# Patient Record
Sex: Male | Born: 1965 | Race: Black or African American | Hispanic: No | Marital: Single | Smoking: Current every day smoker
Health system: Southern US, Community
[De-identification: ages and names within clinical notes are randomized; demographics above are authoritative.]

## PROBLEM LIST (undated history)

## (undated) DIAGNOSIS — I10 Essential (primary) hypertension: Secondary | ICD-10-CM

---

## 1998-03-08 ENCOUNTER — Emergency Department (HOSPITAL_COMMUNITY): Admission: EM | Admit: 1998-03-08 | Discharge: 1998-03-08 | Payer: Self-pay | Admitting: Emergency Medicine

## 1998-03-15 ENCOUNTER — Emergency Department (HOSPITAL_COMMUNITY): Admission: EM | Admit: 1998-03-15 | Discharge: 1998-03-15 | Payer: Self-pay | Admitting: Emergency Medicine

## 2004-04-12 ENCOUNTER — Emergency Department (HOSPITAL_COMMUNITY): Admission: EM | Admit: 2004-04-12 | Discharge: 2004-04-12 | Payer: Self-pay | Admitting: Family Medicine

## 2006-02-03 ENCOUNTER — Emergency Department (HOSPITAL_COMMUNITY): Admission: EM | Admit: 2006-02-03 | Discharge: 2006-02-03 | Payer: Self-pay | Admitting: Emergency Medicine

## 2006-11-01 ENCOUNTER — Ambulatory Visit: Payer: Self-pay | Admitting: Family Medicine

## 2006-11-02 ENCOUNTER — Ambulatory Visit: Payer: Self-pay | Admitting: *Deleted

## 2007-12-11 ENCOUNTER — Encounter (INDEPENDENT_AMBULATORY_CARE_PROVIDER_SITE_OTHER): Payer: Self-pay | Admitting: Family Medicine

## 2007-12-11 ENCOUNTER — Ambulatory Visit: Payer: Self-pay | Admitting: Internal Medicine

## 2007-12-11 LAB — CONVERTED CEMR LAB
ALT: 23 units/L (ref 0–53)
Alkaline Phosphatase: 67 units/L (ref 39–117)
Basophils Absolute: 0 10*3/uL (ref 0.0–0.1)
Eosinophils Absolute: 0.1 10*3/uL (ref 0.0–0.7)
Eosinophils Relative: 3 % (ref 0–5)
Glucose, Bld: 79 mg/dL (ref 70–99)
HCT: 45.9 % (ref 39.0–52.0)
LDL Cholesterol: 119 mg/dL — ABNORMAL HIGH (ref 0–99)
MCV: 89 fL (ref 78.0–100.0)
Platelets: 262 10*3/uL (ref 150–400)
RDW: 13.5 % (ref 11.5–15.5)
Sodium: 141 meq/L (ref 135–145)
Total Bilirubin: 0.3 mg/dL (ref 0.3–1.2)
Total Protein: 7.2 g/dL (ref 6.0–8.3)
Triglycerides: 140 mg/dL (ref <150)
VLDL: 28 mg/dL (ref 0–40)

## 2008-06-27 ENCOUNTER — Ambulatory Visit: Payer: Self-pay | Admitting: Family Medicine

## 2008-07-24 ENCOUNTER — Ambulatory Visit (HOSPITAL_COMMUNITY): Admission: RE | Admit: 2008-07-24 | Discharge: 2008-07-24 | Payer: Self-pay | Admitting: Internal Medicine

## 2008-07-29 ENCOUNTER — Encounter (INDEPENDENT_AMBULATORY_CARE_PROVIDER_SITE_OTHER): Payer: Self-pay | Admitting: Adult Health

## 2008-07-29 ENCOUNTER — Ambulatory Visit: Payer: Self-pay | Admitting: Internal Medicine

## 2008-07-29 LAB — CONVERTED CEMR LAB
ALT: 16 units/L (ref 0–53)
Albumin: 4.6 g/dL (ref 3.5–5.2)
Basophils Absolute: 0 10*3/uL (ref 0.0–0.1)
CO2: 24 meq/L (ref 19–32)
Calcium: 9.5 mg/dL (ref 8.4–10.5)
Chloride: 105 meq/L (ref 96–112)
Cholesterol: 187 mg/dL (ref 0–200)
Hemoglobin: 16.2 g/dL (ref 13.0–17.0)
Lymphocytes Relative: 34 % (ref 12–46)
Monocytes Absolute: 0.4 10*3/uL (ref 0.1–1.0)
Neutro Abs: 2.8 10*3/uL (ref 1.7–7.7)
Neutrophils Relative %: 55 % (ref 43–77)
Potassium: 4.3 meq/L (ref 3.5–5.3)
RDW: 14 % (ref 11.5–15.5)
Sodium: 142 meq/L (ref 135–145)
Total Protein: 7.7 g/dL (ref 6.0–8.3)

## 2008-07-30 ENCOUNTER — Encounter (INDEPENDENT_AMBULATORY_CARE_PROVIDER_SITE_OTHER): Payer: Self-pay | Admitting: Adult Health

## 2008-07-30 LAB — CONVERTED CEMR LAB: Hgb A1c MFr Bld: 5.7 % (ref 4.6–6.1)

## 2008-08-06 ENCOUNTER — Encounter: Admission: RE | Admit: 2008-08-06 | Discharge: 2008-11-04 | Payer: Self-pay | Admitting: Internal Medicine

## 2008-10-21 ENCOUNTER — Ambulatory Visit: Payer: Self-pay | Admitting: Family Medicine

## 2008-10-22 ENCOUNTER — Encounter (INDEPENDENT_AMBULATORY_CARE_PROVIDER_SITE_OTHER): Payer: Self-pay | Admitting: Adult Health

## 2008-10-22 LAB — CONVERTED CEMR LAB
ALT: 12 units/L (ref 0–53)
AST: 11 units/L (ref 0–37)
Albumin: 4.7 g/dL (ref 3.5–5.2)
Alkaline Phosphatase: 78 units/L (ref 39–117)
Calcium: 9.9 mg/dL (ref 8.4–10.5)
Chlamydia, Swab/Urine, PCR: NEGATIVE
Chloride: 107 meq/L (ref 96–112)
GC Probe Amp, Urine: NEGATIVE
LDL Cholesterol: 117 mg/dL — ABNORMAL HIGH (ref 0–99)
PSA: 0.88 ng/mL (ref 0.10–4.00)
Potassium: 4.2 meq/L (ref 3.5–5.3)
Total CHOL/HDL Ratio: 4.3

## 2008-11-05 ENCOUNTER — Ambulatory Visit: Payer: Self-pay | Admitting: Cardiology

## 2008-11-05 ENCOUNTER — Encounter: Payer: Self-pay | Admitting: Internal Medicine

## 2008-11-05 ENCOUNTER — Ambulatory Visit (HOSPITAL_COMMUNITY): Admission: RE | Admit: 2008-11-05 | Discharge: 2008-11-05 | Payer: Self-pay | Admitting: Internal Medicine

## 2009-01-05 ENCOUNTER — Ambulatory Visit: Payer: Self-pay | Admitting: Internal Medicine

## 2009-01-14 ENCOUNTER — Ambulatory Visit: Payer: Self-pay | Admitting: Internal Medicine

## 2009-02-23 ENCOUNTER — Emergency Department (HOSPITAL_COMMUNITY): Admission: EM | Admit: 2009-02-23 | Discharge: 2009-02-23 | Payer: Self-pay | Admitting: Emergency Medicine

## 2009-03-04 ENCOUNTER — Encounter (INDEPENDENT_AMBULATORY_CARE_PROVIDER_SITE_OTHER): Payer: Self-pay | Admitting: Adult Health

## 2009-03-04 ENCOUNTER — Ambulatory Visit: Payer: Self-pay | Admitting: Internal Medicine

## 2009-03-04 LAB — CONVERTED CEMR LAB
AST: 15 units/L (ref 0–37)
Albumin: 4.6 g/dL (ref 3.5–5.2)
Alkaline Phosphatase: 74 units/L (ref 39–117)
BUN: 16 mg/dL (ref 6–23)
Glucose, Bld: 97 mg/dL (ref 70–99)
HDL: 41 mg/dL (ref >39)
LDL Cholesterol: 93 mg/dL (ref 0–99)
Potassium: 4 meq/L (ref 3.5–5.3)
Sodium: 142 meq/L (ref 135–145)
Total Bilirubin: 0.3 mg/dL (ref 0.3–1.2)
Triglycerides: 59 mg/dL (ref <150)
VLDL: 12 mg/dL (ref 0–40)

## 2009-03-11 ENCOUNTER — Ambulatory Visit: Payer: Self-pay | Admitting: Internal Medicine

## 2009-03-11 ENCOUNTER — Encounter (INDEPENDENT_AMBULATORY_CARE_PROVIDER_SITE_OTHER): Payer: Self-pay | Admitting: Adult Health

## 2009-03-11 LAB — CONVERTED CEMR LAB
Bacteria, UA: NONE SEEN
Microalb, Ur: 3.14 mg/dL — ABNORMAL HIGH (ref 0.00–1.89)

## 2009-03-24 ENCOUNTER — Ambulatory Visit (HOSPITAL_COMMUNITY): Admission: RE | Admit: 2009-03-24 | Discharge: 2009-03-24 | Payer: Self-pay | Admitting: Internal Medicine

## 2009-04-01 ENCOUNTER — Ambulatory Visit: Payer: Self-pay | Admitting: Internal Medicine

## 2009-04-01 ENCOUNTER — Encounter (INDEPENDENT_AMBULATORY_CARE_PROVIDER_SITE_OTHER): Payer: Self-pay | Admitting: Adult Health

## 2009-04-01 LAB — CONVERTED CEMR LAB
Amphetamine Screen, Ur: NEGATIVE
Barbiturate Quant, Ur: NEGATIVE
Cocaine Metabolites: NEGATIVE
Creatinine,U: 230.1 mg/dL
Marijuana Metabolite: NEGATIVE
Opiate Screen, Urine: NEGATIVE
Phencyclidine (PCP): NEGATIVE

## 2009-04-29 ENCOUNTER — Ambulatory Visit: Payer: Self-pay | Admitting: Internal Medicine

## 2009-07-01 ENCOUNTER — Ambulatory Visit: Payer: Self-pay | Admitting: Internal Medicine

## 2009-07-24 ENCOUNTER — Encounter (INDEPENDENT_AMBULATORY_CARE_PROVIDER_SITE_OTHER): Payer: Self-pay | Admitting: Adult Health

## 2009-07-24 ENCOUNTER — Ambulatory Visit: Payer: Self-pay | Admitting: Internal Medicine

## 2009-07-24 LAB — CONVERTED CEMR LAB
AST: 15 units/L (ref 0–37)
Albumin: 4.7 g/dL (ref 3.5–5.2)
BUN: 14 mg/dL (ref 6–23)
CO2: 23 meq/L (ref 19–32)
Calcium: 9.6 mg/dL (ref 8.4–10.5)
Chloride: 107 meq/L (ref 96–112)
Cholesterol: 135 mg/dL (ref 0–200)
Creatinine, Ser: 0.94 mg/dL (ref 0.40–1.50)
Glucose, Bld: 92 mg/dL (ref 70–99)
HDL: 37 mg/dL — ABNORMAL LOW (ref >39)
Potassium: 4.1 meq/L (ref 3.5–5.3)
Total CHOL/HDL Ratio: 3.6

## 2009-07-30 ENCOUNTER — Encounter (INDEPENDENT_AMBULATORY_CARE_PROVIDER_SITE_OTHER): Payer: Self-pay | Admitting: *Deleted

## 2009-09-24 ENCOUNTER — Encounter (INDEPENDENT_AMBULATORY_CARE_PROVIDER_SITE_OTHER): Payer: Self-pay | Admitting: Adult Health

## 2009-09-24 ENCOUNTER — Ambulatory Visit: Payer: Self-pay | Admitting: Family Medicine

## 2009-09-24 LAB — CONVERTED CEMR LAB: Chlamydia, Swab/Urine, PCR: NEGATIVE

## 2009-09-25 ENCOUNTER — Encounter (INDEPENDENT_AMBULATORY_CARE_PROVIDER_SITE_OTHER): Payer: Self-pay | Admitting: Adult Health

## 2009-10-27 ENCOUNTER — Ambulatory Visit: Payer: Self-pay | Admitting: Internal Medicine

## 2010-01-26 ENCOUNTER — Ambulatory Visit: Payer: Self-pay | Admitting: Internal Medicine

## 2010-01-26 ENCOUNTER — Encounter (INDEPENDENT_AMBULATORY_CARE_PROVIDER_SITE_OTHER): Payer: Self-pay | Admitting: Adult Health

## 2010-01-26 LAB — CONVERTED CEMR LAB
ALT: 31 units/L (ref 0–53)
Albumin: 4.6 g/dL (ref 3.5–5.2)
Alkaline Phosphatase: 63 units/L (ref 39–117)
CO2: 24 meq/L (ref 19–32)
Cholesterol: 127 mg/dL (ref 0–200)
LDL Cholesterol: 71 mg/dL (ref 0–99)
Potassium: 4.3 meq/L (ref 3.5–5.3)
Sodium: 140 meq/L (ref 135–145)
Total Bilirubin: 0.5 mg/dL (ref 0.3–1.2)
Total Protein: 7.1 g/dL (ref 6.0–8.3)
VLDL: 11 mg/dL (ref 0–40)
Vit D, 25-Hydroxy: 27 ng/mL — ABNORMAL LOW (ref 30–89)
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
## 2010-08-09 ENCOUNTER — Encounter: Payer: Self-pay | Admitting: Internal Medicine

## 2010-08-17 NOTE — Letter (Signed)
Summary: Generic Letter  [facility]  [address]   [place], [place] [address]   Phone: [phone]  Fax: [phone]    07/30/2009  Aaron Oneal [address] [place], [place]  [address]  Dear Aaron Oneal,  You have low Vitamin D start taking Vitamin D 50,000 International Units  one tablet weekly for 12 weeks. You also need to take Calcium 1200mg  one tablet daily which you can buy over the counter at a local pharmacy. After the 12 weeks start taking over the counter Vitamin D 1,000 International Units and continue with the calcium. If you have any questions please feel free to call us.       Sincerely,   Gaylyn Cheers RN

## 2010-09-28 ENCOUNTER — Encounter: Payer: Self-pay | Admitting: Internal Medicine

## 2010-09-28 LAB — CONVERTED CEMR LAB
Chlamydia, Swab/Urine, PCR: NEGATIVE
GC Probe Amp, Urine: NEGATIVE
Hepatitis B Surface Ag: NEGATIVE

## 2019-07-24 ENCOUNTER — Ambulatory Visit: Payer: Self-pay | Attending: Internal Medicine

## 2019-07-24 DIAGNOSIS — Z20822 Contact with and (suspected) exposure to covid-19: Secondary | ICD-10-CM | POA: Insufficient documentation

## 2019-07-25 LAB — NOVEL CORONAVIRUS, NAA: SARS-CoV-2, NAA: NOT DETECTED

## 2019-10-25 ENCOUNTER — Ambulatory Visit: Payer: Self-pay | Attending: Internal Medicine

## 2019-10-25 DIAGNOSIS — Z23 Encounter for immunization: Secondary | ICD-10-CM

## 2019-10-25 NOTE — Progress Notes (Signed)
   Covid-19 Vaccination Clinic  Name:  Aaron Oneal    MRN: [id number] DOB: Dec 04, 1965  10/25/2019  [name] was observed post Covid-19 immunization for 15 minutes without incident. He was provided with Vaccine Information Sheet and instruction to access the V-Safe system.   [name] was instructed to call 911 with any severe reactions post vaccine: Marland Kitchen Difficulty breathing  . Swelling of face and throat  . A fast heartbeat  . A bad rash all over body  . Dizziness and weakness   Immunizations Administered    Name Date Dose VIS Date Route   Pfizer COVID-19 Vaccine 10/25/2019 10:17 AM 0.3 mL 06/28/2019 Intramuscular   Manufacturer: [employer], [employer]   Lot: [id number]   NDC: [address]-6

## 2019-11-19 ENCOUNTER — Ambulatory Visit: Payer: Self-pay | Attending: Internal Medicine

## 2019-11-19 DIAGNOSIS — Z23 Encounter for immunization: Secondary | ICD-10-CM

## 2019-11-19 NOTE — Progress Notes (Signed)
   Covid-19 Vaccination Clinic  Name:  Aaron Oneal    MRN: [id number] DOB: [birth year]  11/19/2019  [name] was observed post Covid-19 immunization for 15 minutes without incident. He was provided with Vaccine Information Sheet and instruction to access the V-Safe system.   [name] was instructed to call 911 with any severe reactions post vaccine: Marland Kitchen Difficulty breathing  . Swelling of face and throat  . A fast heartbeat  . A bad rash all over body  . Dizziness and weakness   Immunizations Administered    Name Date Dose VIS Date Route   Pfizer COVID-19 Vaccine 11/19/2019  9:11 AM 0.3 mL 09/11/2018 Intramuscular   Manufacturer: [employer], [employer]   Lot: [id number]   NDC: [address]-4

## 2024-07-20 ENCOUNTER — Ambulatory Visit
Admission: RE | Admit: 2024-07-20 | Discharge: 2024-07-20 | Disposition: A | Discharge status: Home / Self Care | Source: Ambulatory Visit | Attending: Emergency Medicine | Admitting: Emergency Medicine

## 2024-07-20 VITALS — BP 104/66 | HR 85 | Temp 97.6°F | Resp 18

## 2024-07-20 DIAGNOSIS — R319 Hematuria, unspecified: Secondary | ICD-10-CM

## 2024-07-20 HISTORY — DX: Essential (primary) hypertension: I10

## 2024-07-20 LAB — POCT URINE DIPSTICK
Glucose, UA: NEGATIVE mg/dL
Ketones, POC UA: NEGATIVE mg/dL
Leukocytes, UA: NEGATIVE
Nitrite, UA: NEGATIVE
Spec Grav, UA: 1.02
Urobilinogen, UA: 2 U/dL — AB
pH, UA: 6

## 2024-07-20 NOTE — ED Triage Notes (Signed)
 States started with slight hematuria 07/06/24, which resolved. Denies any dysuria, or polyuria or any pain. Pt states was getting concerned about the episode and wanted to get checked.

## 2024-07-20 NOTE — Discharge Instructions (Addendum)
 As discussed your urine did show a small amount of blood in it today I did not reveal any signs of infection at this time.  We are going to send a urine culture to confirm if there is any presence of infection that could be related to your symptoms.  If the urine culture results require any additional treatment someone will call to let you know. Otherwise I recommend following up with [facility] for further evaluation. Follow-up with your primary care provider or return here as needed.

## 2024-07-20 NOTE — ED Provider Notes (Signed)
 " [facility]    CSN: [id number] Arrival date & time: 07/20/24  1426      History   Chief Complaint Chief Complaint  Patient presents with   Hematuria    HPI Aaron Oneal is a 59 y.o. male.   Patient presents with 1 episode of hematuria that occurred on 12/20.  Patient states that he has not had any episodes of hematuria since.  Patient denies any dysuria, urinary frequency/urgency, abdominal pain, flank pain, and fever.  Patient also denies any abnormal penile discharge, penile/testicular pain or swelling.  Patient reports that he is sexually active but with 1 partner and does not have any concerns for STDs.  The history is provided by the patient and medical records.  Hematuria    Past Medical History:  Diagnosis Date   Hypertension     There are no active problems to display for this patient.   History reviewed. No pertinent surgical history.     Home Medications    Prior to Admission medications  Medication Sig Start Date End Date Taking? Authorizing Provider  metFORMIN (GLUCOPHAGE-XR) 500 MG 24 hr tablet Take 500 mg by mouth daily with breakfast. 06/17/24  Yes [provider]  amLODipine (NORVASC) 10 MG tablet Take 10 mg by mouth daily.    [provider]  hydrochlorothiazide (HYDRODIURIL) 12.5 MG tablet Take 12.5 mg by mouth daily.    [provider]  valsartan (DIOVAN) 160 MG tablet Take 160 mg by mouth daily.    [provider]    Family History No family history on file.  Social History Social History[1]   Allergies   Patient has no known allergies.   Review of Systems Review of Systems  Genitourinary:  Positive for hematuria.   Per HPI  Physical Exam Triage Vital Signs ED Triage Vitals  Encounter Vitals Group     BP 07/20/24 1451 104/66     Girls Systolic BP Percentile --      Girls Diastolic BP Percentile --      Boys Systolic BP Percentile --      Boys Diastolic BP Percentile --       Pulse Rate 07/20/24 1451 85     Resp 07/20/24 1451 18     Temp 07/20/24 1451 97.6 F (36.4 C)     Temp Source 07/20/24 1451 Oral     SpO2 07/20/24 1451 94 %     Weight --      Height --      Head Circumference --      Peak Flow --      Pain Score 07/20/24 1505 0     Pain Loc --      Pain Education --      Exclude from Growth Chart --    No data found.  Updated Vital Signs BP 104/66 (BP Location: Right Arm)   Pulse 85   Temp 97.6 F (36.4 C) (Oral)   Resp 18   SpO2 94%   Visual Acuity Right Eye Distance:   Left Eye Distance:   Bilateral Distance:    Right Eye Near:   Left Eye Near:    Bilateral Near:     Physical Exam Vitals and nursing note reviewed.  Constitutional:      General: He is awake. He is not in acute distress.    Appearance: Normal appearance. He is well-developed and well-groomed. He is not ill-appearing.  Abdominal:     General: Abdomen is flat. Bowel  sounds are normal. There is no distension.     Palpations: Abdomen is soft. There is no mass.     Tenderness: There is no abdominal tenderness. There is no right CVA tenderness, left CVA tenderness, guarding or rebound.     Hernia: No hernia is present.  Skin:    General: Skin is warm and dry.  Neurological:     General: No focal deficit present.     Mental Status: He is alert and oriented to person, place, and time. Mental status is at baseline.  Psychiatric:        Behavior: Behavior is cooperative.      UC Treatments / Results  Labs (all labs ordered are listed, but only abnormal results are displayed) Labs Reviewed  POCT URINE DIPSTICK - Abnormal; Notable for the following components:      Result Value   Color, UA orange (*)    Clarity, UA cloudy (*)    Bilirubin, UA small (*)    Blood, UA trace-intact (*)    Urobilinogen, UA 2.0 (*)    All other components within normal limits  URINE CULTURE    EKG   Radiology No results found.  Procedures Procedures (including critical care  time)  Medications Ordered in UC Medications - No data to display  Initial Impression / Assessment and Plan / UC Course  I have reviewed the triage vital signs and the nursing notes.  Pertinent labs & imaging results that were available during my care of the patient were reviewed by me and considered in my medical decision making (see chart for details).     Patient is overall well-appearing.  Vitals are stable.  Urinalysis did reveal trace intact RBCs, will send urine culture to confirm if there is any presence of urinary tract infection related to this.  Given urology follow-up.  Discussed follow-up, return, and strict ER precautions. Final Clinical Impressions(s) / UC Diagnoses   Final diagnoses:  Hematuria, unspecified type     Discharge Instructions      As discussed your urine did show a small amount of blood in it today I did not reveal any signs of infection at this time.  We are going to send a urine culture to confirm if there is any presence of infection that could be related to your symptoms.  If the urine culture results require any additional treatment someone will call to let you know. Otherwise I recommend following up with [facility] for further evaluation. Follow-up with your primary care provider or return here as needed.   ED Prescriptions   None    PDMP not reviewed this encounter.    [1]  Social History Tobacco Use   Smoking status: Every Day    Types: Cigarettes   Smokeless tobacco: Never  Vaping Use   Vaping status: Never Used  Substance Use Topics   Alcohol use: Not Currently    Comment: been off drugs and alcohol for a while   Drug use: Not Currently    Comment: been off drugs and alcohol for a while     Johnie Rumaldo LABOR, NP 07/20/24 1601  "

## 2024-07-22 ENCOUNTER — Ambulatory Visit (HOSPITAL_COMMUNITY): Payer: Self-pay

## 2024-07-22 LAB — URINE CULTURE: Culture: NO GROWTH
